# Patient Record
Sex: Male | Born: 1990 | Race: White | Hispanic: No | Marital: Single | Smoking: Current every day smoker
Health system: Southern US, Community
[De-identification: ages and names within clinical notes are randomized; demographics above are authoritative.]

---
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
## 2013-05-20 ENCOUNTER — Encounter (HOSPITAL_COMMUNITY): Payer: Self-pay | Admitting: Emergency Medicine

## 2013-05-20 ENCOUNTER — Emergency Department (INDEPENDENT_AMBULATORY_CARE_PROVIDER_SITE_OTHER)
Admission: EM | Admit: 2013-05-20 | Discharge: 2013-05-20 | Disposition: A | Discharge status: Home / Self Care | Payer: Worker's Compensation | Source: Home / Self Care

## 2013-05-20 ENCOUNTER — Emergency Department (INDEPENDENT_AMBULATORY_CARE_PROVIDER_SITE_OTHER): Payer: Worker's Compensation

## 2013-05-20 DIAGNOSIS — S6980XA Other specified injuries of unspecified wrist, hand and finger(s), initial encounter: Secondary | ICD-10-CM

## 2013-05-20 DIAGNOSIS — M795 Residual foreign body in soft tissue: Secondary | ICD-10-CM | POA: Diagnosis not present

## 2013-05-20 DIAGNOSIS — S6990XA Unspecified injury of unspecified wrist, hand and finger(s), initial encounter: Secondary | ICD-10-CM

## 2013-05-20 MED ORDER — TETANUS-DIPHTH-ACELL PERTUSSIS 5-2.5-18.5 LF-MCG/0.5 IM SUSP
0.5000 mL | Freq: Once | INTRAMUSCULAR | Status: AC
  Administered 2013-05-20: 0.5 mL via INTRAMUSCULAR

## 2013-05-20 MED ORDER — DOXYCYCLINE HYCLATE 100 MG PO TABS: 100.0000 mg | ORAL_TABLET | Freq: Two times a day (BID) | ORAL | Status: AC

## 2013-05-20 MED ORDER — TETANUS-DIPHTHERIA TOXOIDS TD 5-2 LFU IM INJ: 0.5000 mL | INJECTION | Freq: Once | INTRAMUSCULAR | Status: DC

## 2013-05-20 MED ORDER — TETANUS-DIPHTH-ACELL PERTUSSIS 5-2.5-18.5 LF-MCG/0.5 IM SUSP
INTRAMUSCULAR | Status: AC
  Filled 2013-05-20: qty 0.5

## 2013-05-20 NOTE — ED Notes (Signed)
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

## 2013-05-20 NOTE — Discharge Instructions (Signed)
You injured your finger and have a very small piece of wood still in your finger This will likely not cause you any longterm issues as your body will wall it off and turn it into fibrotic tissue Please take the antibiotics for infection Apply ice tonight and then heat thereafter to promote healing Please come back if you finger continues to swell or becomes increasingly painful Please take ibuprofen 600mg  every 6 hours for the next 1-2 days for the pain and swelling.

## 2013-05-20 NOTE — ED Provider Notes (Signed)
CSN: [id number]     Arrival date & time 05/20/13  1734 History   None    Chief Complaint  Patient presents with  . Foreign Body in Skin   (Consider location/radiation/quality/duration/timing/severity/associated sxs/prior Treatment) HPI  Foreign body in R hand: splinter went through hand yesterday evening. 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 Able to pull the majority of the wood out of hand which had entered on the ventral surface and was tenting the skin on the dorsum of the finger. Sensation and movement intact. Has not taken anything gfor the pain. Becoming more swollen.    History reviewed. No pertinent past medical history. History reviewed. No pertinent past surgical history. History reviewed. No pertinent family history. History  Substance Use Topics  . Smoking status: Current Every Day Smoker -- 0.50 packs/day  . Smokeless tobacco: Not on file  . Alcohol Use: Yes    Review of Systems  All other systems reviewed and are negative.   Allergies  Review of patient's allergies indicates no known allergies.  Home Medications   Prior to Admission medications   Not on File   BP 130/63  Pulse 84  Temp(Src) 98.5 F (36.9 C) (Oral)  SpO2 96% Physical Exam  Constitutional: He is oriented to person, place, and time. He appears well-developed and well-nourished.  HENT:  Head: Normocephalic and atraumatic.  Eyes: EOM are normal. Pupils are equal, round, and reactive to light.  Neck: Normal range of motion. Neck supple.  Cardiovascular: Normal rate, normal heart sounds and intact distal pulses.  Exam reveals no gallop.   No murmur heard. Pulmonary/Chest: Effort normal and breath sounds normal.  Abdominal: Soft. Bowel sounds are normal.  Musculoskeletal: Normal range of motion. He exhibits edema.  Neurological: He is alert and oriented to person, place, and time. He has normal reflexes.  Skin: Skin is  warm.  R index finger w/ swelling between the MCP and PIP. W/ ventral small <1cm cut wound and ventral bulge w/ small dark flecks. No fluctuance or discharge  Psychiatric: He has a normal mood and affect. His behavior is normal. Judgment and thought content normal.    ED Course  Procedures (including critical care time) Labs Review Labs Reviewed - No data to display  Imaging Review Dg Finger Index Right  05/20/2013   CLINICAL DATA:  Right index finger splinter.  EXAM: RIGHT INDEX FINGER 2+V  COMPARISON:  None.  FINDINGS: There is no evidence of fracture or dislocation. There is no evidence of arthropathy or other focal bone abnormality. Soft tissues are unremarkable. No radiopaque foreign bodies within the digit or bubbles of gas, which can be seen with wood fragment.  IMPRESSION: Negative.   Electronically Signed   By: Awilda Metroourtnay  Bloomer   On: 05/20/2013 19:04    Provider performed US: small 1x322mm hyperechoic density noted 0.5cm below the level of the skin next to an artery in long view along the lateral aspect of the skin/[place] tissue. No abscess noted or bony abnormality, Tendons intact.   Foreign Body Removal: after obtaining verbal consent pt was anesthetised w/ a digital block w/ 2% Lidocaine w/o epinephrine. A 27guage needle was used to open the skin along the dorsal surface and removed a small 1mm piece of foreign material. No purulent discharge seen. Bleeding resolved and pt tolerated the process well.  Larger foreign body not removed due to depth and proximity to artery.    MDM  1. Foreign body (FB) in soft tissue   2. Finger injury    22yo w/ foreign body in index finger from wood splinter. Small piece removed and larger piece left in place as likely to become walled off and fibrosed in subcutaneous tissue. No sign of major injury to vascular system, nerves, or tendons.  - start Doxy - Tetanus given - precautions given and all question sanswerd  Shelly Flattenavid Lugene Beougher, MD Family  Medicine PGY-3 05/20/2013, 7:56 PM      Ozella Rocksavid J Rachael Zapanta, MD 05/20/13 [phone]

## 2013-05-21 NOTE — ED Provider Notes (Signed)
Medical screening examination/treatment/procedure(s) were performed by a resident physician and as supervising physician I was immediately available for consultation/collaboration.  Leslee Homeavid Hollie Wojahn, M.D.  Reuben Likesavid C Aubrei Bouchie, MD 05/21/13 [phone]

## 2014-09-15 IMAGING — CR DG FINGER INDEX 2+V*R*
3 series · 3 of 3 positions shown · non-contrast
Comparison: None.

CLINICAL DATA: Right index finger splinter.

EXAM:
RIGHT INDEX FINGER 2+V

[view not recorded (1 of 3)]
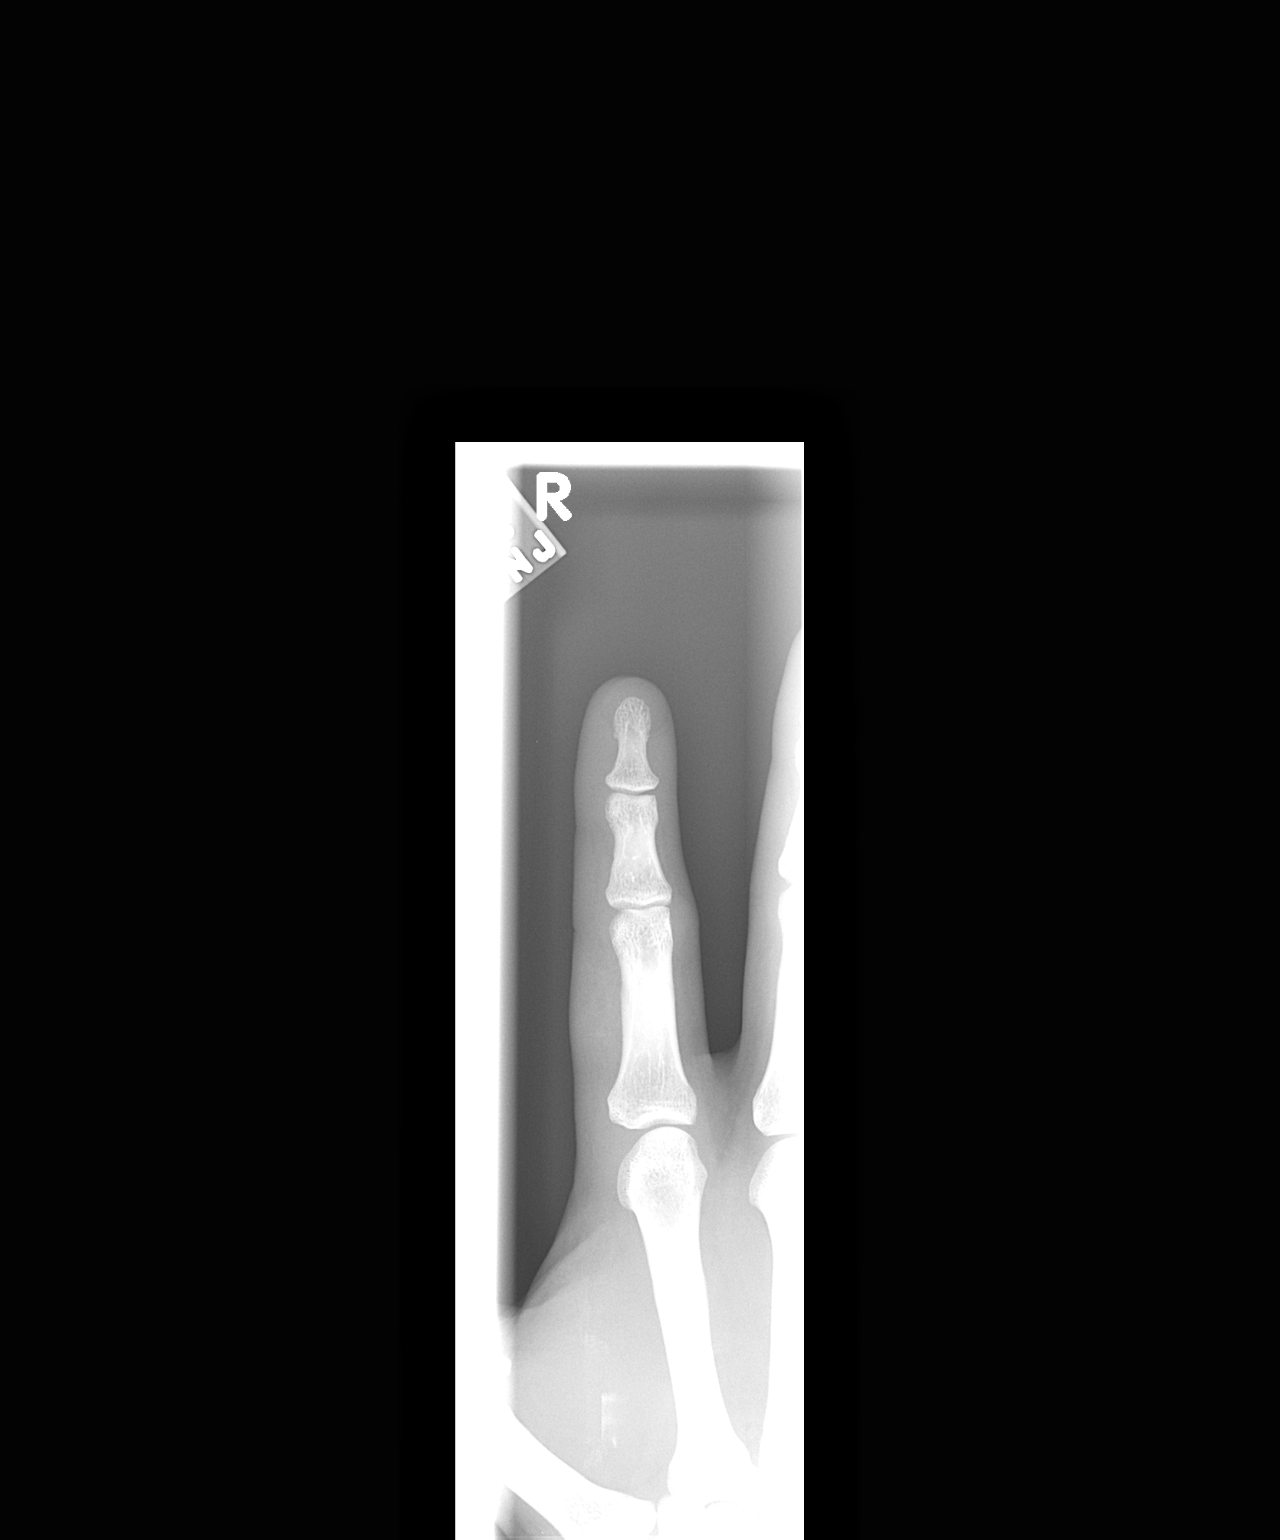

[view not recorded (2 of 3)]
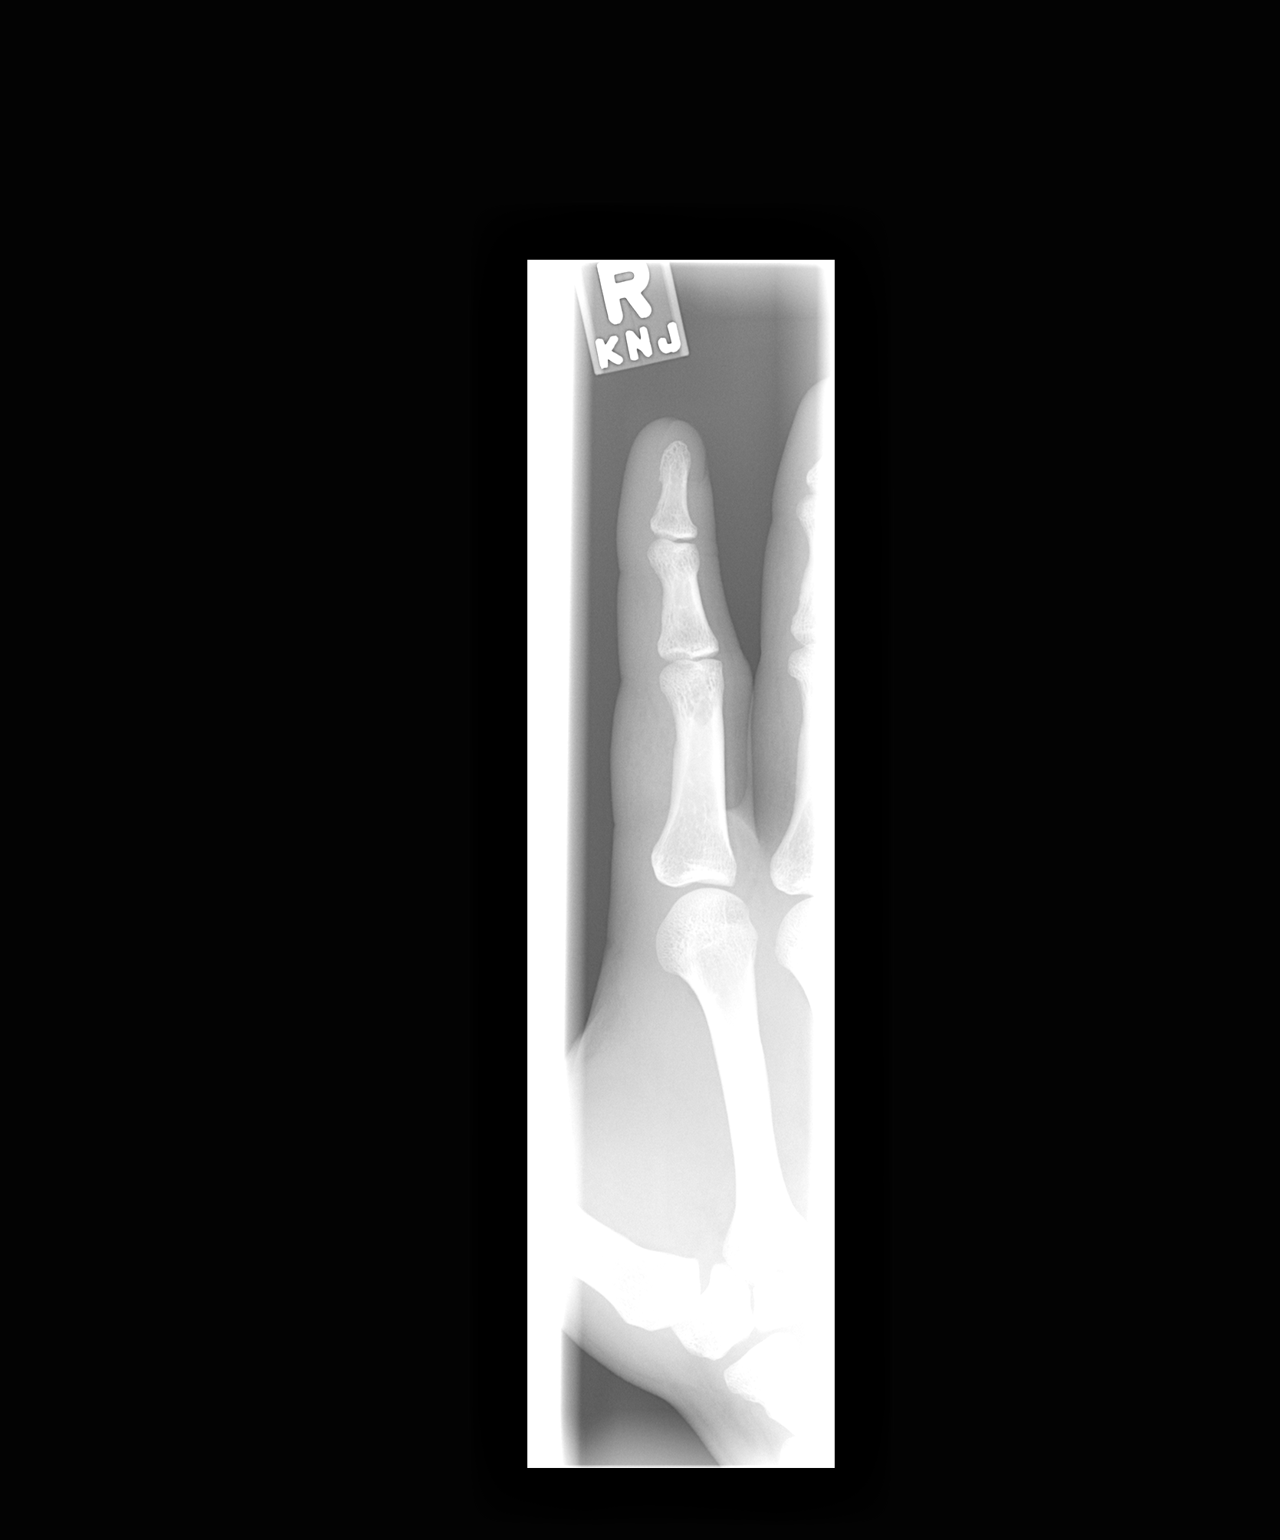

[view not recorded (3 of 3)]
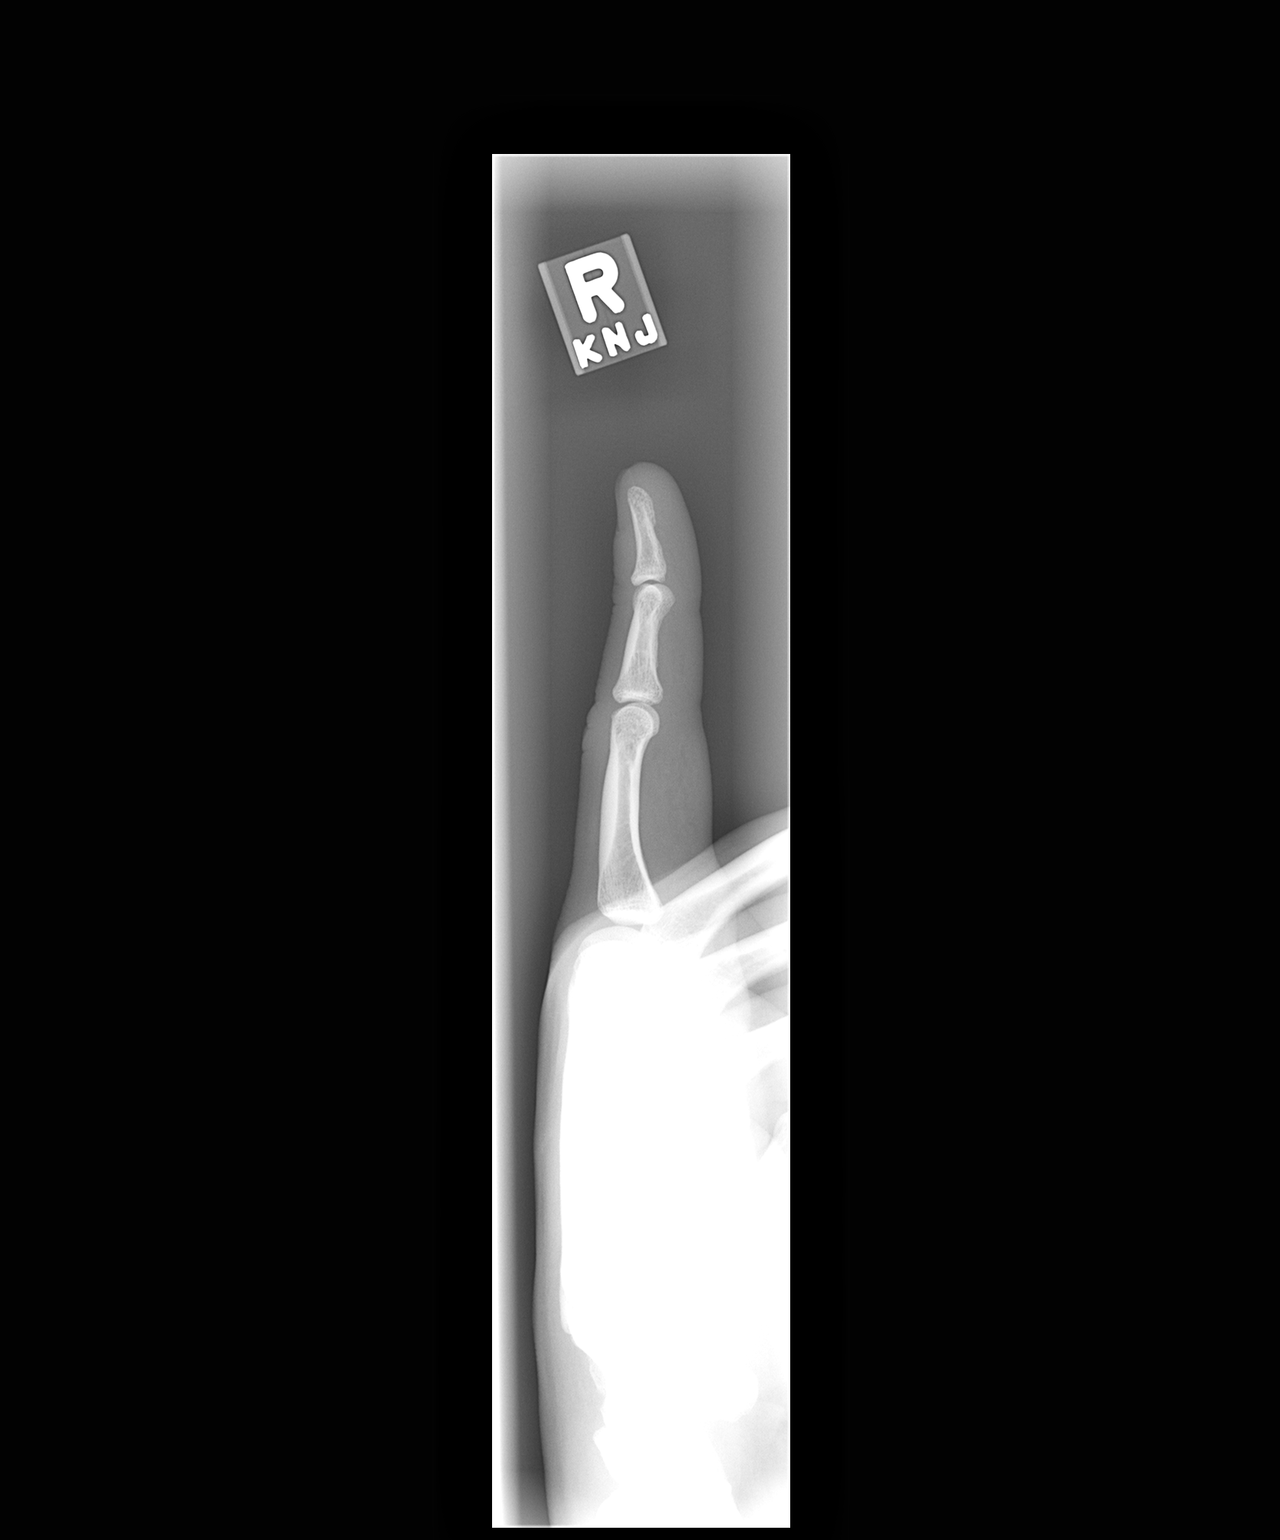

[3 of 3 positions shown; findings below may reference images not displayed]

FINDINGS: There is no evidence of fracture or dislocation. There is no
evidence of arthropathy or other focal bone abnormality. Soft
tissues are unremarkable. No radiopaque foreign bodies within the
digit or bubbles of gas, which can be seen with Marlit fragment.
IMPRESSION: Negative.

  By: Kashif Corea

## 2021-09-14 ENCOUNTER — Emergency Department (HOSPITAL_COMMUNITY)
Admission: EM | Admit: 2021-09-14 | Discharge: 2021-09-14 | Disposition: A | Discharge status: Home / Self Care | Payer: Self-pay | Attending: Emergency Medicine | Admitting: Emergency Medicine

## 2021-09-14 DIAGNOSIS — Z23 Encounter for immunization: Secondary | ICD-10-CM | POA: Insufficient documentation

## 2021-09-14 DIAGNOSIS — S61512A Laceration without foreign body of left wrist, initial encounter: Secondary | ICD-10-CM | POA: Insufficient documentation

## 2021-09-14 DIAGNOSIS — W28XXXA Contact with powered lawn mower, initial encounter: Secondary | ICD-10-CM | POA: Insufficient documentation

## 2021-09-14 MED ORDER — LIDOCAINE HCL 2 % IJ SOLN
15.0000 mL | Freq: Once | INTRAMUSCULAR | Status: AC
  Administered 2021-09-14: 300 mg
  Filled 2021-09-14: qty 20

## 2021-09-14 MED ORDER — TETANUS-DIPHTH-ACELL PERTUSSIS 5-2.5-18.5 LF-MCG/0.5 IM SUSY
0.5000 mL | PREFILLED_SYRINGE | Freq: Once | INTRAMUSCULAR | Status: AC
  Administered 2021-09-14: 0.5 mL via INTRAMUSCULAR
  Filled 2021-09-14: qty 0.5

## 2021-09-14 NOTE — ED Provider Triage Note (Signed)
Emergency Medicine Provider Triage Evaluation Note  Maurice Norton , a 31 y.o. male  was evaluated in triage.  Pt complains of laceration to left forearm. Work injury. Unknown last tdap  Review of Systems  Positive: Pain, laceration Negative:   Physical Exam  BP 121/72 (BP Location: Right Arm)   Pulse 84   Temp (!) 97.5 F (36.4 C) (Oral)   Resp 14   SpO2 99%  Gen:   Awake, no distress   Resp:  Normal effort  MSK:   Moves extremities without difficulty  Other:  2.5 inch volar forearm lac, bleeding controlled. No sign of arterial bleed  Medical Decision Making  Medically screening exam initiated at 9:47 AM.  Appropriate orders placed.  Maurice Norton was informed that the remainder of the evaluation will be completed by another provider, this initial triage assessment does not replace that evaluation, and the importance of remaining in the ED until their evaluation is complete.    Gets nauseous and presyncopal when seeing blood. Will need repair   Maurice Benders, PA-C 09/14/21 [phone]

## 2021-09-14 NOTE — Discharge Instructions (Signed)
Your stitches need to come out in 7 to 10 days.  You may do this with a primary care, and urgent care or return to the emergency department as needed.  Do not scrub the area and only wash with mild soap.  You may use bacitracin over the wound.  Read the information about laceration repair and return with any fevers, chills, thick drainage or other concerns.  It was a pleasure to meet you, your tetanus shot was updated today and we hope you feel better!

## 2021-09-14 NOTE — ED Triage Notes (Signed)
Pt. Stated, I was fixing a lawn mowr blade and it kicked back on me. 2-3 inch cut and inch deep to left wrist.

## 2021-09-14 NOTE — ED Provider Notes (Signed)
[facility] EMERGENCY DEPARTMENT Provider Note   CSN: [id number] Arrival date & time: 09/14/21  0931     History  Chief Complaint  Patient presents with   Extremity Laceration    Maurice Norton is a 31 y.o. male presenting today after wrist injury.  Reports that he was mowing the lawn and part of the metal and the lawnmower came up and cut his wrist.  Unknown last tetanus.  HPI     Home Medications Prior to Admission medications   Medication Sig Start Date End Date Taking? Authorizing Provider  doxycycline (VIBRA-TABS) 100 MG tablet Take 1 tablet (100 mg total) by mouth 2 (two) times daily. 05/20/13   Ozella Rocks, MD      Allergies    Patient has no known allergies.    Review of Systems   Review of Systems  Physical Exam Updated Vital Signs BP 128/78   Pulse 72   Temp (!) 97.5 F (36.4 C) (Oral)   Resp 16   SpO2 97%  Physical Exam Vitals and nursing note reviewed.  Constitutional:      Appearance: Normal appearance.  HENT:     Head: Normocephalic and atraumatic.  Eyes:     General: No scleral icterus.    Conjunctiva/sclera: Conjunctivae normal.  Cardiovascular:     Pulses: Normal pulses.  Pulmonary:     Effort: Pulmonary effort is normal. No respiratory distress.  Skin:    Findings: No rash.     Comments: 2.5 inch oblique laceration to the left volar wrist.  Bleeding controlled.  No exposed vasculature, tendons, nerves or bone.  Sensation intact distally  Neurological:     Mental Status: He is alert.  Psychiatric:        Mood and Affect: Mood normal.     ED Results / Procedures / Treatments   Labs (all labs ordered are listed, but only abnormal results are displayed) Labs Reviewed - No data to display  EKG None  Radiology No results found.  Procedures .Marland KitchenLaceration Repair  Date/Time: 09/14/2021 12:32 PM  Performed by: Saddie Benders, PA-C Authorized by: Saddie Benders, PA-C   Consent:    Consent obtained:   Verbal   Consent given by:  Patient   Risks, benefits, and alternatives were discussed: yes     Risks discussed:  Poor cosmetic result, infection, pain and need for additional repair   Alternatives discussed:  No treatment Universal protocol:    Patient identity confirmed:  Verbally with patient Anesthesia:    Anesthesia method:  Local infiltration   Local anesthetic:  Lidocaine 2% WITH epi Laceration details:    Location:  Shoulder/arm   Shoulder/arm location:  L lower arm   Length (cm):  2.5 Pre-procedure details:    Preparation:  Patient was prepped and draped in usual sterile fashion Exploration:    Hemostasis achieved with:  Direct pressure   Imaging outcome: foreign body not noted     Wound exploration: wound explored through full range of motion and entire depth of wound visualized     Wound extent: no muscle damage noted, no nerve damage noted, no tendon damage noted, no underlying fracture noted and no vascular damage noted     Contaminated: yes   Treatment:    Area cleansed with:  Saline   Amount of cleaning:  Extensive   Irrigation solution:  Sterile water   Irrigation volume:  150cc   Irrigation method:  Pressure wash and syringe   Visualized  foreign bodies/material removed: yes   Skin repair:    Repair method:  Sutures   Suture size:  5-0   Suture material:  Nylon   Suture technique:  Simple interrupted   Number of sutures:  4 Approximation:    Approximation:  Close Repair type:    Repair type:  Simple Post-procedure details:    Dressing:  Antibiotic ointment Comments:     Suture repair performed by myself as well as PA-S Jae Dire.  I rechecked after her suturing and wound looked successfully repaired.    Medications Ordered in ED Medications  Tdap (BOOSTRIX) injection 0.5 mL (0.5 mLs Intramuscular Given 09/14/21 1005)  lidocaine (XYLOCAINE) 2 % (with pres) injection 300 mg (300 mg Infiltration Given 09/14/21 1027)    ED Course/ Medical Decision Making/ A&P                            Medical Decision Making Risk Prescription drug management.  31 year old male presenting with a left wrist injury.  Unknown last tetanus.  Treatment: 4 nylon sutures were placed in his wrist.  Tetanus was updated.  MDM/disposition: Patient with 2.5 cm laceration to the left anterior wrist.  Bleeding was controlled.  Suture repair was performed without complications.  Patient will have the stitches out in 7 to 10 days.  He is agreeable.  Given bacitracin and information about proper care at home.  Also given information about signs of infection.  Discharged and neurovascularly intact condition  Final Clinical Impression(s) / ED Diagnoses Final diagnoses:  Laceration of left wrist, initial encounter    Rx / DC Orders ED Discharge Orders     None      Results and diagnoses were explained to the patient. Return precautions discussed in full. Patient had no additional questions and expressed complete understanding.   This chart was dictated using voice recognition software.  Despite best efforts to proofread,  errors can occur which can change the documentation meaning.    Saddie Benders, PA-C 09/14/21 1235    Maia Plan, MD 09/14/21 [phone]
